# Patient Record
Sex: Male | Born: 1985 | Race: Black or African American | Hispanic: No | Marital: Married | Smoking: Never smoker
Health system: Southern US, Community
[De-identification: ages and names within clinical notes are randomized; demographics above are authoritative.]

---
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
## 2015-02-27 ENCOUNTER — Emergency Department (HOSPITAL_COMMUNITY): Payer: Self-pay

## 2015-02-27 ENCOUNTER — Emergency Department (HOSPITAL_COMMUNITY)
Admission: EM | Admit: 2015-02-27 | Discharge: 2015-02-27 | Disposition: A | Discharge status: Home / Self Care | Payer: Self-pay | Attending: Physician Assistant | Admitting: Physician Assistant

## 2015-02-27 ENCOUNTER — Encounter (HOSPITAL_COMMUNITY): Payer: Self-pay | Admitting: Cardiology

## 2015-02-27 DIAGNOSIS — S29002A Unspecified injury of muscle and tendon of back wall of thorax, initial encounter: Secondary | ICD-10-CM | POA: Insufficient documentation

## 2015-02-27 DIAGNOSIS — S199XXA Unspecified injury of neck, initial encounter: Secondary | ICD-10-CM | POA: Insufficient documentation

## 2015-02-27 DIAGNOSIS — Y92410 Unspecified street and highway as the place of occurrence of the external cause: Secondary | ICD-10-CM | POA: Insufficient documentation

## 2015-02-27 DIAGNOSIS — Y998 Other external cause status: Secondary | ICD-10-CM | POA: Insufficient documentation

## 2015-02-27 DIAGNOSIS — Y9389 Activity, other specified: Secondary | ICD-10-CM | POA: Insufficient documentation

## 2015-02-27 MED ORDER — IBUPROFEN 800 MG PO TABS: 800.0000 mg | ORAL_TABLET | Freq: Three times a day (TID) | ORAL | Status: AC

## 2015-02-27 MED ORDER — METHOCARBAMOL 500 MG PO TABS: 500.0000 mg | ORAL_TABLET | Freq: Two times a day (BID) | ORAL | Status: AC

## 2015-02-27 MED ORDER — METHOCARBAMOL 500 MG PO TABS
1000.0000 mg | ORAL_TABLET | Freq: Once | ORAL | Status: AC
  Administered 2015-02-27: 1000 mg via ORAL
  Filled 2015-02-27: qty 2

## 2015-02-27 MED ORDER — IBUPROFEN 400 MG PO TABS
800.0000 mg | ORAL_TABLET | Freq: Once | ORAL | Status: AC
  Administered 2015-02-27: 800 mg via ORAL
  Filled 2015-02-27: qty 2
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
## 2015-02-27 NOTE — ED Notes (Signed)
Pt ambulatory to room.
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
